# Patient Record
Sex: Male | Born: 1965 | Race: White | Hispanic: No | Marital: Married | Smoking: Never smoker
Health system: Southern US, Community
[De-identification: ages and names within clinical notes are randomized; demographics above are authoritative.]

## PROBLEM LIST (undated history)

## (undated) DIAGNOSIS — N289 Disorder of kidney and ureter, unspecified: Secondary | ICD-10-CM

---

## 2016-01-14 ENCOUNTER — Encounter (HOSPITAL_COMMUNITY): Payer: Self-pay | Admitting: *Deleted

## 2016-01-14 ENCOUNTER — Emergency Department (HOSPITAL_COMMUNITY): Payer: Managed Care, Other (non HMO)

## 2016-01-14 ENCOUNTER — Emergency Department (HOSPITAL_COMMUNITY)
Admission: EM | Admit: 2016-01-14 | Discharge: 2016-01-14 | Disposition: A | Discharge status: Home / Self Care | Payer: Managed Care, Other (non HMO) | Attending: Emergency Medicine | Admitting: Emergency Medicine

## 2016-01-14 DIAGNOSIS — N23 Unspecified renal colic: Secondary | ICD-10-CM | POA: Diagnosis not present

## 2016-01-14 DIAGNOSIS — R109 Unspecified abdominal pain: Secondary | ICD-10-CM

## 2016-01-14 DIAGNOSIS — R911 Solitary pulmonary nodule: Secondary | ICD-10-CM

## 2016-01-14 DIAGNOSIS — Z87442 Personal history of urinary calculi: Secondary | ICD-10-CM | POA: Insufficient documentation

## 2016-01-14 HISTORY — DX: Disorder of kidney and ureter, unspecified: N28.9

## 2016-01-14 LAB — URINE MICROSCOPIC-ADD ON

## 2016-01-14 LAB — BASIC METABOLIC PANEL
ANION GAP: 7 (ref 5–15)
BUN: 17 mg/dL (ref 6–20)
CO2: 27 mmol/L (ref 22–32)
Calcium: 8.8 mg/dL — ABNORMAL LOW (ref 8.9–10.3)
Chloride: 104 mmol/L (ref 101–111)
Creatinine, Ser: 1.26 mg/dL — ABNORMAL HIGH (ref 0.61–1.24)
Glucose, Bld: 118 mg/dL — ABNORMAL HIGH (ref 65–99)
POTASSIUM: 3.8 mmol/L (ref 3.5–5.1)
SODIUM: 138 mmol/L (ref 135–145)

## 2016-01-14 LAB — URINALYSIS, ROUTINE W REFLEX MICROSCOPIC
Bilirubin Urine: NEGATIVE
GLUCOSE, UA: NEGATIVE mg/dL
Ketones, ur: NEGATIVE mg/dL
LEUKOCYTES UA: NEGATIVE
Nitrite: NEGATIVE
PROTEIN: 30 mg/dL — AB
Specific Gravity, Urine: 1.025 (ref 1.005–1.030)
pH: 6 (ref 5.0–8.0)

## 2016-01-14 MED ORDER — ONDANSETRON 4 MG PO TBDP: ORAL_TABLET | ORAL | Status: DC

## 2016-01-14 MED ORDER — HYDROCODONE-ACETAMINOPHEN 5-325 MG PO TABS: 1.0000 | ORAL_TABLET | ORAL | Status: DC | PRN

## 2016-01-14 MED ORDER — HYDROMORPHONE HCL 1 MG/ML IJ SOLN
0.5000 mg | Freq: Once | INTRAMUSCULAR | Status: DC
Start: 2016-01-14 — End: 2016-01-14

## 2016-01-14 MED ORDER — SODIUM CHLORIDE 0.9 % IV BOLUS (SEPSIS)
1000.0000 mL | Freq: Once | INTRAVENOUS | Status: AC
  Administered 2016-01-14: 1000 mL via INTRAVENOUS

## 2016-01-14 MED ORDER — KETOROLAC TROMETHAMINE 30 MG/ML IJ SOLN
30.0000 mg | Freq: Once | INTRAMUSCULAR | Status: AC
  Administered 2016-01-14: 30 mg via INTRAVENOUS
  Filled 2016-01-14: qty 1

## 2016-01-14 MED ORDER — HYDROMORPHONE HCL 1 MG/ML IJ SOLN
1.0000 mg | Freq: Once | INTRAMUSCULAR | Status: AC
  Administered 2016-01-14: 1 mg via INTRAVENOUS
  Filled 2016-01-14: qty 1

## 2016-01-14 MED ORDER — MORPHINE SULFATE (PF) 4 MG/ML IV SOLN
8.0000 mg | Freq: Once | INTRAVENOUS | Status: AC
  Administered 2016-01-14: 8 mg via INTRAVENOUS
  Filled 2016-01-14: qty 2

## 2016-01-14 MED ORDER — NAPROXEN 500 MG PO TABS: 500.0000 mg | ORAL_TABLET | Freq: Two times a day (BID) | ORAL | Status: DC | PRN

## 2016-01-14 NOTE — Discharge Instructions (Signed)
Stay well-hydrated, take pain meds as needed to control pain. Strain urine and follow-up with urology. Return to the ER for uncontrolled pain, persistent vomiting, fevers or new concerns.  If you were given medicines take as directed.  If you are on coumadin or contraceptives realize their levels and effectiveness is altered by many different medicines.  If you have any reaction (rash, tongues swelling, other) to the medicines stop taking and see a physician.    If your blood pressure was elevated in the ER make sure you follow up for management with a primary doctor or return for chest pain, shortness of breath or stroke symptoms.  Please follow up as directed and return to the ER or see a physician for new or worsening symptoms.  Thank you. Filed Vitals:   01/14/16 0203  BP: 156/71  Pulse: 56  Temp: 97.7 F (36.5 C)  TempSrc: Oral  Resp: 18  Height: 5\' 9"  (1.753 m)  Weight: 193 lb (87.544 kg)  SpO2: 97%

## 2016-01-14 NOTE — ED Provider Notes (Signed)
CSN: [id number]     Arrival date & time 01/14/16  [id number] History   First MD Initiated Contact with Patient 01/14/16 0221     Chief Complaint  Patient presents with  . Flank Pain     (Consider location/radiation/quality/duration/timing/severity/associated sxs/prior Treatment) HPI Comments: 50 year old male with kidney stone history years ago presents with worsening flank pain on the left intermittent difficulty getting comfortable since yesterday. Mild decreased appetite. No vomiting. Mild urgency. No fevers or chills. No focal abdominal pain. Nothing specifically improves pain, time.  Patient is a 50 y.o. male presenting with flank pain. The history is provided by the patient.  Flank Pain Pertinent negatives include no chest pain, no abdominal pain, no headaches and no shortness of breath.    Past Medical History  Diagnosis Date  . Renal disorder    History reviewed. No pertinent past surgical history. History reviewed. No pertinent family history. Social History  Substance Use Topics  . Smoking status: Never Smoker   . Smokeless tobacco: None  . Alcohol Use: No    Review of Systems  Constitutional: Positive for appetite change. Negative for fever and chills.  HENT: Negative for congestion.   Eyes: Negative for visual disturbance.  Respiratory: Negative for shortness of breath.   Cardiovascular: Negative for chest pain.  Gastrointestinal: Negative for vomiting and abdominal pain.  Genitourinary: Positive for flank pain. Negative for dysuria.  Musculoskeletal: Negative for back pain, neck pain and neck stiffness.  Skin: Negative for rash.  Neurological: Negative for light-headedness and headaches.      Allergies  Penicillins  Home Medications   Prior to Admission medications   Medication Sig Start Date End Date Taking? Authorizing Provider  HYDROcodone-acetaminophen (NORCO) 5-325 MG tablet Take 1-2 tablets by mouth every 4 (four) hours as needed. 01/14/16   Blane OharaJoshua  Trashaun Streight, MD  naproxen (NAPROSYN) 500 MG tablet Take 1 tablet (500 mg total) by mouth 2 (two) times daily as needed. 01/14/16   Blane OharaJoshua Asser Lucena, MD  ondansetron (ZOFRAN ODT) 4 MG disintegrating tablet 4mg  ODT q4 hours prn nausea/vomit 01/14/16   Blane OharaJoshua Zikeria Keough, MD   BP 156/71 mmHg  Pulse 56  Temp(Src) 97.7 F (36.5 C) (Oral)  Resp 18  Ht 5\' 9"  (1.753 m)  Wt 193 lb (87.544 kg)  BMI 28.49 kg/m2  SpO2 97% Physical Exam  Constitutional: He is oriented to person, place, and time. He appears well-developed and well-nourished.  HENT:  Head: Normocephalic and atraumatic.  Eyes: Right eye exhibits no discharge. Left eye exhibits no discharge.  Neck: Normal range of motion. Neck supple. No tracheal deviation present.  Cardiovascular: Regular rhythm.   Pulmonary/Chest: Effort normal and breath sounds normal.  Abdominal: Soft. He exhibits no distension. There is no tenderness. There is no guarding.  Musculoskeletal: He exhibits no edema.  Neurological: He is alert and oriented to person, place, and time.  Skin: Skin is warm. No rash noted.  Psychiatric: He has a normal mood and affect.  Nursing note and vitals reviewed.   ED Course  Procedures (including critical care time) Emergency Focused Ultrasound Exam Limited retroperitoneal ultrasound of kidneys  Performed and interpreted by Dr. Jodi MourningZavitz Indication: flank pain Focused abdominal ultrasound with both kidneys imaged in transverse and longitudinal planes in real-time. Interpretation: mild left hydronephrosis visualized.   Images archived electronically  CPT Code: [id number] 575 187576775-26 (limited retroperitoneal)  Labs Review Labs Reviewed  URINALYSIS, ROUTINE W REFLEX MICROSCOPIC (NOT [facility]) - Abnormal; Notable for the following:    Hgb urine dipstick LARGE (*)  Protein, ur 30 (*)    All other components within normal limits  BASIC METABOLIC PANEL - Abnormal; Notable for the following:    Glucose, Bld 118 (*)    Creatinine, Ser 1.26 (*)     Calcium 8.8 (*)    All other components within normal limits  URINE MICROSCOPIC-ADD ON - Abnormal; Notable for the following:    Squamous Epithelial / LPF 0-5 (*)    Bacteria, UA FEW (*)    All other components within normal limits    Imaging Review Ct Renal Stone Study  01/14/2016  CLINICAL DATA:  Left flank pain and urinary urgency. Nausea and vomiting. EXAM: CT ABDOMEN AND PELVIS WITHOUT CONTRAST TECHNIQUE: Multidetector CT imaging of the abdomen and pelvis was performed following the standard protocol without IV contrast. COMPARISON:  None. FINDINGS: Lower chest: 3 mm right middle lobe nodule image 4 series 6. Lungs otherwise clear. Liver: Equivocal steatosis. No evidence of focal lesion allowing for lack contrast. Hepatobiliary: Gallbladder physiologically distended, no calcified stone. No biliary dilatation. Pancreas: No ductal dilatation or inflammation. Spleen: Normal. Adrenal glands: No nodule. Kidneys: Obstructing 4 mm stone at the left ureterovesicular junction with moderate proximal hydroureteronephrosis and perinephric stranding. Nonobstructing 3 mm stone in the left lower kidney. Punctate stone in the lower right kidney without right obstructive uropathy. Stomach/Bowel: Stomach physiologically distended. There are no dilated or thickened small bowel loops. Small volume of stool throughout the colon without colonic wall thickening. The appendix is tentatively identified and normal. Vascular/Lymphatic: No retroperitoneal adenopathy. Abdominal aorta is normal in caliber. Reproductive: Normal sized prostate gland. Bladder: Decompressed, no wall thickening. Other: No free air, free fluid, or intra-abdominal fluid collection. Fat in the left inguinal canal. Tiny fat containing umbilical hernia. Musculoskeletal: There are no acute or suspicious osseous abnormalities. IMPRESSION: 1. Obstructing 4 mm stone at the left ureterovesicular junction with moderate hydronephrosis and perinephric stranding. 2.  Additional nonobstructing stones in both kidneys. 3. Right middle lobe pulmonary nodule measuring 3 mm. No follow-up needed if patient is low-risk. Non-contrast chest CT can be considered in 12 months if patient is high-risk. This recommendation follows the consensus statement: Guidelines for Management of Incidental Pulmonary Nodules Detected on CT Images:From the Fleischner Society 2017; published online before print (10.1148/radiol.4098119147). Electronically Signed   By: Rubye Oaks M.D.   On: 01/14/2016 04:50   I have personally reviewed and evaluated these images and lab results as part of my medical decision-making.   EKG Interpretation None      MDM   Final diagnoses:  Acute left flank pain  Incidental pulmonary nodule, less than or equal to 3mm  Renal colic on left side    Patient presents with clinical concern for kidney stone. Patient intermittent waves of pain in the ER. Bedside ultrasound mild hydronephrosis. Urinalysis pending. Plan for IV fluids, screening kidney function and close outpatient follow-up. Initially the plan is to hold on CT scan however patient's pain worsened on reassessment. CT scan ordered for further delineation. Repeat pain meds ordered. Pain controlled at dc.  Discussed small nodule and fup. Results and differential diagnosis were discussed with the patient/parent/guardian. Xrays were independently reviewed by myself.  Close follow up outpatient was discussed, comfortable with the plan.   Medications  sodium chloride 0.9 % bolus 1,000 mL (1,000 mLs Intravenous New Bag/Given 01/14/16 0256)  ketorolac (TORADOL) 30 MG/ML injection 30 mg (30 mg Intravenous Given 01/14/16 0255)  HYDROmorphone (DILAUDID) injection 1 mg (1 mg Intravenous Given 01/14/16 0255)  morphine  4 MG/ML injection 8 mg (8 mg Intravenous Given 01/14/16 0409)    Filed Vitals:   01/14/16 0203  BP: 156/71  Pulse: 56  Temp: 97.7 F (36.5 C)  TempSrc: Oral  Resp: 18  Height:   (1.753 m)  Weight: 193 lb (87.544 kg)  SpO2: 97%    Final diagnoses:  Acute left flank pain  Incidental pulmonary nodule, less than or equal to 3mm  Renal colic on left side       Blane Ohara, MD 01/14/16 0530

## 2016-01-14 NOTE — ED Notes (Addendum)
Pt reports left sided flank pain and urinary urgency. Pt denies n/v.

## 2021-05-07 ENCOUNTER — Emergency Department (HOSPITAL_COMMUNITY)
Admission: EM | Admit: 2021-05-07 | Discharge: 2021-05-07 | Disposition: A | Discharge status: Home / Self Care | Payer: BC Managed Care – PPO | Attending: Emergency Medicine | Admitting: Emergency Medicine

## 2021-05-07 ENCOUNTER — Emergency Department (HOSPITAL_COMMUNITY): Payer: BC Managed Care – PPO

## 2021-05-07 ENCOUNTER — Encounter (HOSPITAL_COMMUNITY): Payer: Self-pay

## 2021-05-07 ENCOUNTER — Other Ambulatory Visit: Payer: Self-pay

## 2021-05-07 DIAGNOSIS — R7989 Other specified abnormal findings of blood chemistry: Secondary | ICD-10-CM | POA: Insufficient documentation

## 2021-05-07 DIAGNOSIS — M545 Low back pain, unspecified: Secondary | ICD-10-CM | POA: Insufficient documentation

## 2021-05-07 DIAGNOSIS — R109 Unspecified abdominal pain: Secondary | ICD-10-CM | POA: Insufficient documentation

## 2021-05-07 LAB — CBC
HCT: 50.2 % (ref 39.0–52.0)
Hemoglobin: 17.3 g/dL — ABNORMAL HIGH (ref 13.0–17.0)
MCH: 30.9 pg (ref 26.0–34.0)
MCHC: 34.5 g/dL (ref 30.0–36.0)
MCV: 89.6 fL (ref 80.0–100.0)
Platelets: 158 10*3/uL (ref 150–400)
RBC: 5.6 MIL/uL (ref 4.22–5.81)
RDW: 12.9 % (ref 11.5–15.5)
WBC: 5.6 10*3/uL (ref 4.0–10.5)
nRBC: 0 % (ref 0.0–0.2)

## 2021-05-07 LAB — BASIC METABOLIC PANEL
Anion gap: 4 — ABNORMAL LOW (ref 5–15)
BUN: 13 mg/dL (ref 6–20)
CO2: 28 mmol/L (ref 22–32)
Calcium: 8.5 mg/dL — ABNORMAL LOW (ref 8.9–10.3)
Chloride: 106 mmol/L (ref 98–111)
Creatinine, Ser: 1.33 mg/dL — ABNORMAL HIGH (ref 0.61–1.24)
GFR, Estimated: >60 mL/min (ref >60)
Glucose, Bld: 104 mg/dL — ABNORMAL HIGH (ref 70–99)
Potassium: 4.7 mmol/L (ref 3.5–5.1)
Sodium: 138 mmol/L (ref 135–145)

## 2021-05-07 LAB — URINALYSIS, DIPSTICK ONLY
Bilirubin Urine: NEGATIVE
Glucose, UA: NEGATIVE mg/dL
Hgb urine dipstick: NEGATIVE
Ketones, ur: 5 mg/dL — AB
Leukocytes,Ua: NEGATIVE
Nitrite: NEGATIVE
Protein, ur: 30 mg/dL — AB
Specific Gravity, Urine: 1.029 (ref 1.005–1.030)
pH: 6 (ref 5.0–8.0)

## 2021-05-07 MED ORDER — CYCLOBENZAPRINE HCL 10 MG PO TABS: 10.0000 mg | ORAL_TABLET | Freq: Two times a day (BID) | ORAL | 0 refills | Status: DC | PRN

## 2021-05-07 MED ORDER — NAPROXEN 500 MG PO TABS: 500.0000 mg | ORAL_TABLET | Freq: Two times a day (BID) | ORAL | 0 refills | Status: DC

## 2021-05-07 MED ORDER — KETOROLAC TROMETHAMINE 30 MG/ML IJ SOLN
30.0000 mg | Freq: Once | INTRAMUSCULAR | Status: AC
  Administered 2021-05-07: 30 mg via INTRAVENOUS
  Filled 2021-05-07: qty 1

## 2021-05-07 NOTE — ED Provider Notes (Signed)
[facility] EMERGENCY DEPARTMENT Provider Note   CSN: [id number] Arrival date & time: 05/07/21  1006     History Chief Complaint  Patient presents with   Back Pain    Andrew Sims is a 55 y.o. male.   Back Pain  Patient presents to the ED for evaluation of back pain.  Patient states its in his lower back somewhat on both sides but maybe more on the right.  Symptoms started the last day or so.  It does seem to move towards the right groin area.  Patient feels like he gets worse with certain movements and positions including sitting down and standing up.  He denies any urinary symptoms.  Patient denies any specific falls or injury.  No excessive heavy lifting.  Does have a history of kidney stones but is not sure if this is a kidney stone or a pulled muscle.  He is not having any nausea or vomiting.  Pain is more intense and severe and that is why he came to the ED today  Past Medical History:  Diagnosis Date   Renal disorder     There are no problems to display for this patient.   History reviewed. No pertinent surgical history.     History reviewed. No pertinent family history.  Social History   Tobacco Use   Smoking status: Never   Smokeless tobacco: Never  Vaping Use   Vaping Use: Never used  Substance Use Topics   Alcohol use: No   Drug use: No    Home Medications Prior to Admission medications   Medication Sig Start Date End Date Taking? Authorizing Provider  cyclobenzaprine (FLEXERIL) 10 MG tablet Take 1 tablet (10 mg total) by mouth 2 (two) times daily as needed for muscle spasms. 05/07/21  Yes Linwood Dibbles, MD  naproxen (NAPROSYN) 500 MG tablet Take 1 tablet (500 mg total) by mouth 2 (two) times daily with a meal. As needed for pain 05/07/21  Yes Linwood Dibbles, MD  HYDROcodone-acetaminophen (NORCO) 5-325 MG tablet Take 1-2 tablets by mouth every 4 (four) hours as needed. 01/14/16   Blane Ohara, MD  ondansetron (ZOFRAN ODT) 4 MG disintegrating tablet 4mg  ODT q4  hours prn nausea/vomit 01/14/16   01/16/16, MD    Allergies    Penicillins  Review of Systems   Review of Systems  Musculoskeletal:  Positive for back pain.  All other systems reviewed and are negative.  Physical Exam Updated Vital Signs BP 131/75 (BP Location: Right Arm)   Pulse 65   Temp 98.5 F (36.9 C) (Oral)   Resp 18   Ht 1.753 m (5\' 9" )   Wt 88.5 kg   SpO2 97%   BMI 28.80 kg/m   Physical Exam Vitals and nursing note reviewed.  Constitutional:      General: He is not in acute distress.    Appearance: He is well-developed.  HENT:     Head: Normocephalic and atraumatic.     Right Ear: External ear normal.     Left Ear: External ear normal.  Eyes:     General: No scleral icterus.       Right eye: No discharge.        Left eye: No discharge.     Conjunctiva/sclera: Conjunctivae normal.  Neck:     Trachea: No tracheal deviation.  Cardiovascular:     Rate and Rhythm: Normal rate and regular rhythm.  Pulmonary:     Effort: Pulmonary effort is normal. No respiratory distress.  Breath sounds: Normal breath sounds. No stridor. No wheezing or rales.  Abdominal:     General: Bowel sounds are normal. There is no distension.     Palpations: Abdomen is soft.     Tenderness: There is no abdominal tenderness. There is no guarding or rebound.  Musculoskeletal:        General: No tenderness or deformity.     Cervical back: Neck supple.     Comments: Tenderness palpation right sided lower back  Skin:    General: Skin is warm and dry.     Findings: No rash.  Neurological:     General: No focal deficit present.     Mental Status: He is alert.     Cranial Nerves: No cranial nerve deficit (no facial droop, extraocular movements intact, no slurred speech).     Sensory: No sensory deficit.     Motor: No abnormal muscle tone or seizure activity.     Coordination: Coordination normal.  Psychiatric:        Mood and Affect: Mood normal.    ED Results / Procedures /  Treatments   Labs (all labs ordered are listed, but only abnormal results are displayed) Labs Reviewed  CBC - Abnormal; Notable for the following components:      Result Value   Hemoglobin 17.3 (*)    All other components within normal limits  BASIC METABOLIC PANEL - Abnormal; Notable for the following components:   Glucose, Bld 104 (*)    Creatinine, Ser 1.33 (*)    Calcium 8.5 (*)    Anion gap 4 (*)    All other components within normal limits  URINALYSIS, DIPSTICK ONLY - Abnormal; Notable for the following components:   Ketones, ur 5 (*)    Protein, ur 30 (*)    All other components within normal limits    EKG None  Radiology CT Renal Stone Study  Result Date: 05/07/2021 CLINICAL DATA:  Low back pain.  Flank pain. EXAM: CT ABDOMEN AND PELVIS WITHOUT CONTRAST TECHNIQUE: Multidetector CT imaging of the abdomen and pelvis was performed following the standard protocol without IV contrast. COMPARISON:  Jan 14, 2016 FINDINGS: Lower chest: No acute abnormality. Hepatobiliary: No focal liver abnormality is seen. No gallstones, gallbladder wall thickening, or biliary dilatation. Pancreas: Unremarkable. No pancreatic ductal dilatation or surrounding inflammatory changes. Spleen: Normal in size without focal abnormality. Adrenals/Urinary Tract: There is a 4 mm stone in the lower pole of the right kidney. There is a 2 mm stone in the mid region of the left kidney on coronal image 62. There is a larger 3 mm stone in the lower pole the left kidney. No hydronephrosis or perinephric stranding. No suspicious masses. No ureterectasis. The bladder is normal. Stomach/Bowel: The stomach and small bowel are unremarkable. Colon and appendix are normal. Vascular/Lymphatic: No significant vascular findings are present. No enlarged abdominal or pelvic lymph nodes. Reproductive: Prostate is unremarkable. Other: A small fat containing left inguinal hernia is identified. A fat containing umbilical hernia is  identified. Musculoskeletal: No acute or significant osseous findings. IMPRESSION: Nonobstructive stones in both kidneys. No cause for the patient's acute flank pain identified. Electronically Signed   By: Gerome Sam III M.D.   On: 05/07/2021 11:52    Procedures Procedures   Medications Ordered in ED Medications  ketorolac (TORADOL) 30 MG/ML injection 30 mg (30 mg Intravenous Given 05/07/21 1110)    ED Course  I have reviewed the triage vital signs and the nursing notes.  Pertinent labs & imaging results that were available during my care of the patient were reviewed by me and considered in my medical decision making (see chart for details).  Clinical Course as of 05/07/21 1306  Sun May 07, 2021  1219 Creatinine slightly elevated but similar to previous values [JK]  1221 Urinalysis without signs of infection [JK]  1221 Kidney stones noted but no ureteral stones [JK]    Clinical Course User Index [JK] Linwood Dibbles, MD   MDM Rules/Calculators/A&P                           Patient presented to the ED for evaluation of of back pain.  Patient has history of kidney stones.  Unclear if symptoms could be related to kidney stones versus musculoskeletal back pain.  Patient had no abdominal tenderness.  Initial laboratory tests were reassuring.  Slight increase in creatinine noted however he does have history of elevated creatinine noted on previous values.  I doubt that is related to his symptoms.  Urinalysis without signs infection.  CT scan does not show any acute abnormalities.  Will discharge home with medications for pain and muscle relaxant.  Recommend outpatient follow-up with a primary care doctor regarding his creatinine. Final Clinical Impression(s) / ED Diagnoses Final diagnoses:  Acute low back pain without sciatica, unspecified back pain laterality  Elevated serum creatinine    Rx / DC Orders ED Discharge Orders          Ordered    naproxen (NAPROSYN) 500 MG tablet  2  times daily with meals        05/07/21 1244    cyclobenzaprine (FLEXERIL) 10 MG tablet  2 times daily PRN        05/07/21 1244             Linwood Dibbles, MD 05/07/21 1308

## 2021-05-07 NOTE — Discharge Instructions (Signed)
Take the medications as needed for pain.  Follow-up with your your primary care doctor to check on your kidney function.

## 2021-05-07 NOTE — ED Triage Notes (Signed)
Pt to er, pt states that he has a hx of kidney stones and this feels similar, pt states that his lower back pain started the other day when he was walking around in the yard, pt ambulatory.   Pt denies trauma or heavy lifting.

## 2021-12-27 IMAGING — CT CT RENAL STONE PROTOCOL
2 of 4 series · 16 of 46 positions shown, 18 images · non-contrast
Comparison: January 14, 2016

CLINICAL DATA: Low back pain.  Flank pain.

EXAM:
CT ABDOMEN AND PELVIS WITHOUT CONTRAST
TECHNIQUE: Multidetector CT imaging of the abdomen and pelvis was performed
following the standard protocol without IV contrast.

[Series 2: axial st · axial · 0.98mm/px · z∈[+385,+840]mm · 13 of 103 slices shown, 15 images]
[im 6/103  soft-tissue]
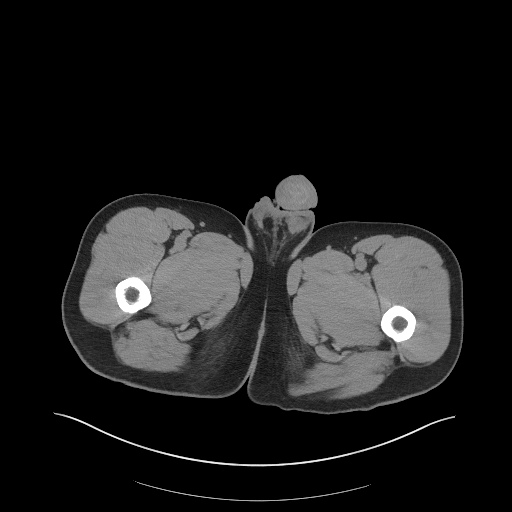
[im 6/103  bone]
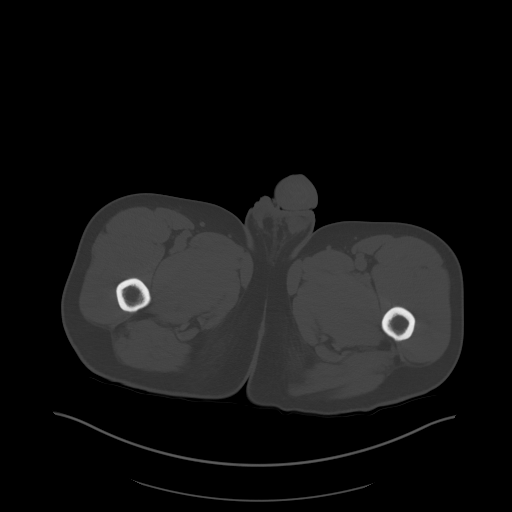
[im 17/103  soft-tissue]
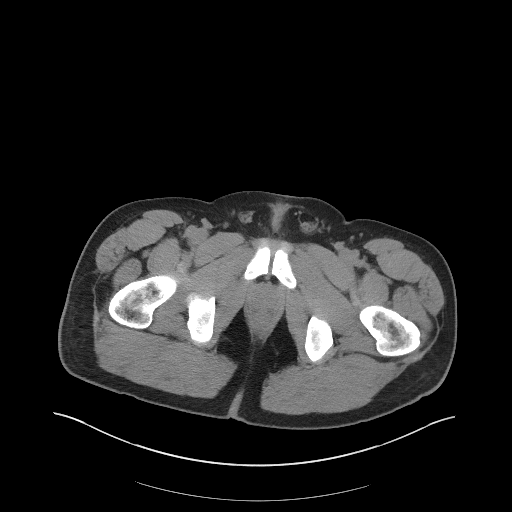
[im 22/103  soft-tissue]
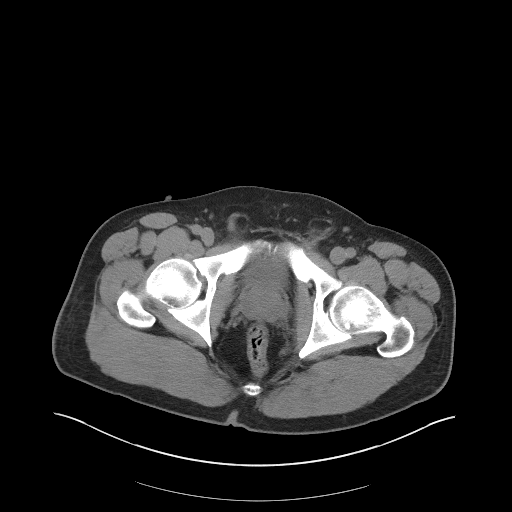
[im 27/103  soft-tissue]
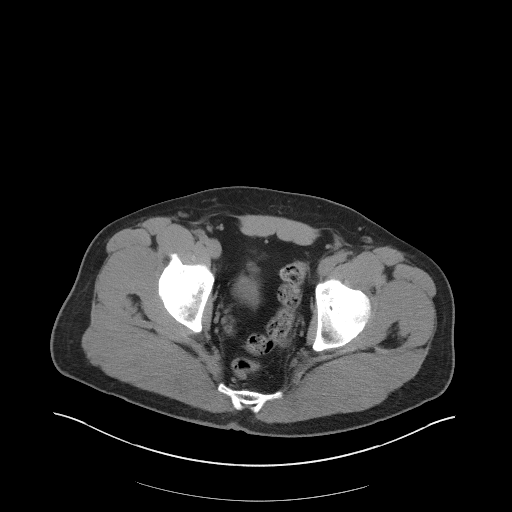
[im 38/103  soft-tissue]
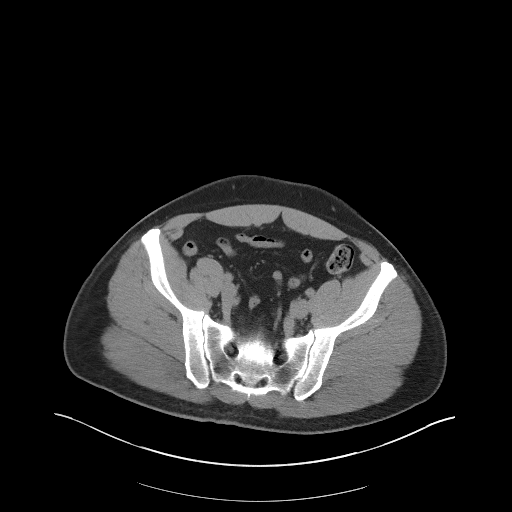
[im 43/103  soft-tissue]
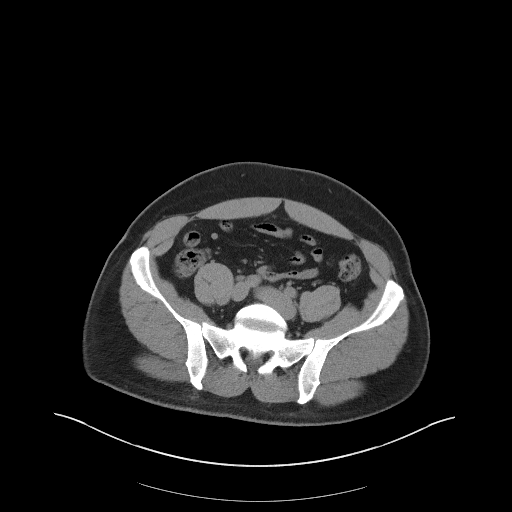
[im 54/103  soft-tissue]
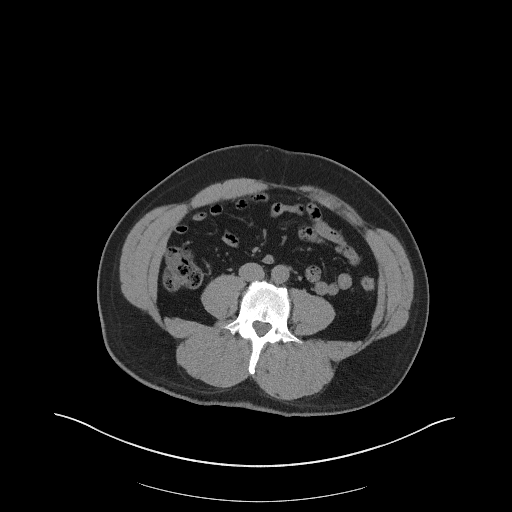
[im 60/103  soft-tissue]
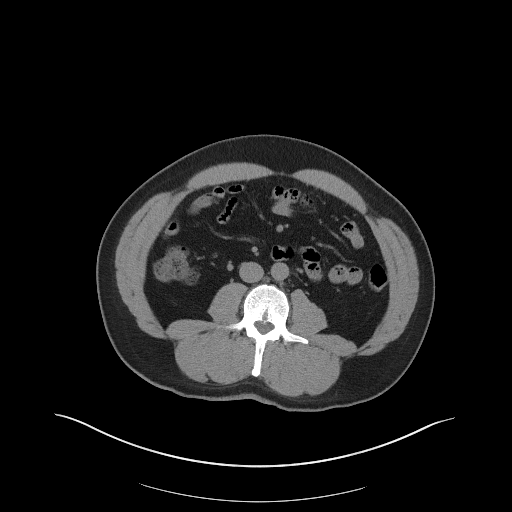
[im 65/103  soft-tissue]
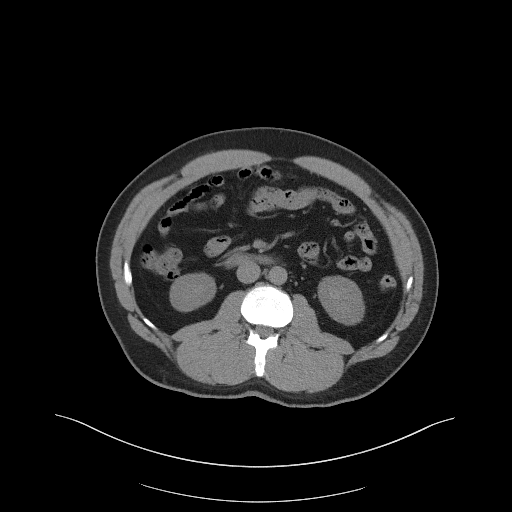
[im 65/103  bone]
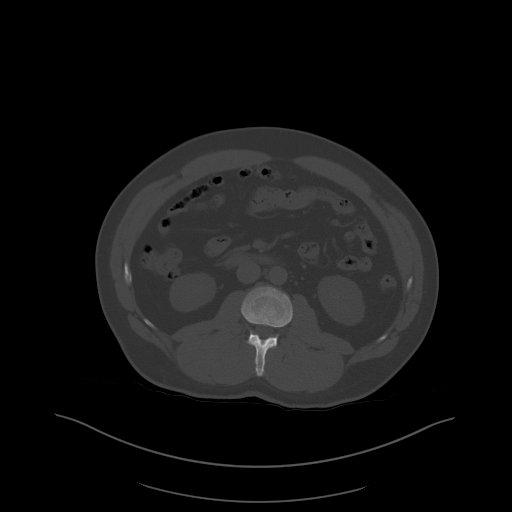
[im 76/103  soft-tissue]
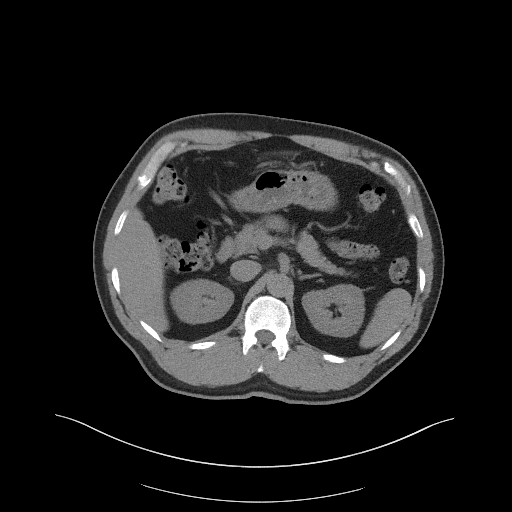
[im 81/103  soft-tissue]
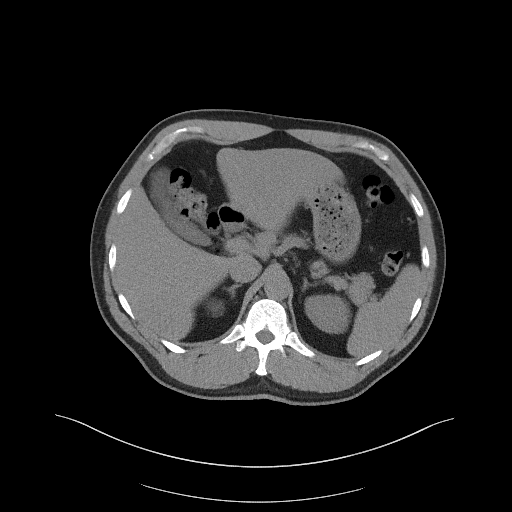
[im 86/103  soft-tissue]
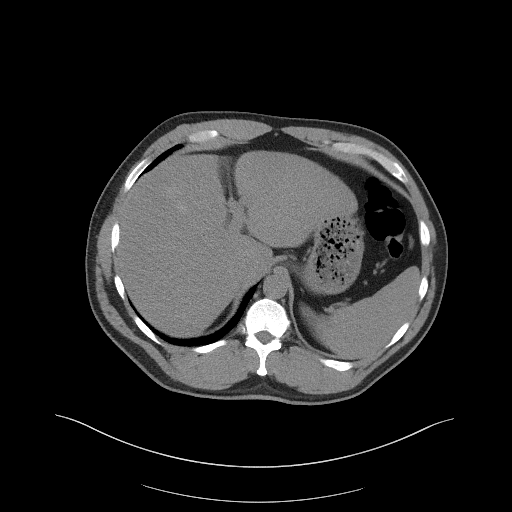
[im 97/103  soft-tissue]
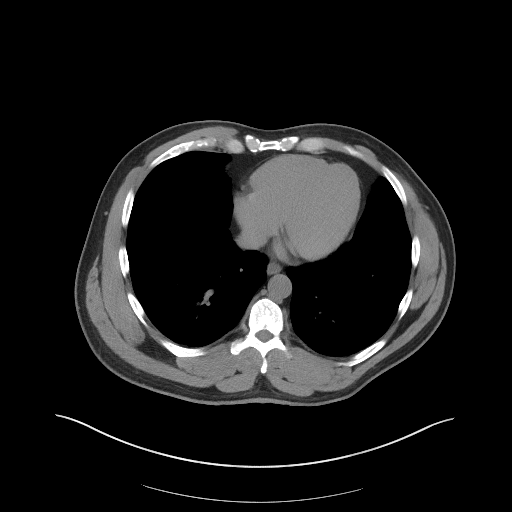

[Series 5: coronal st · coronal · 0.86mm/px · 3 of 120 slices shown]
[im 40/120  soft-tissue]
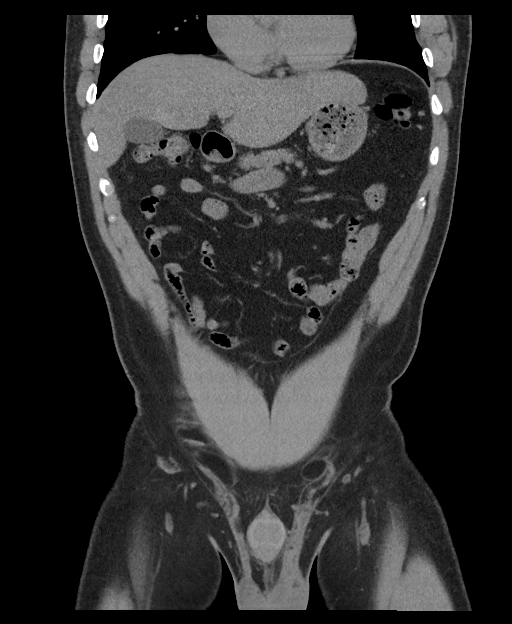
[im 53/120  soft-tissue]
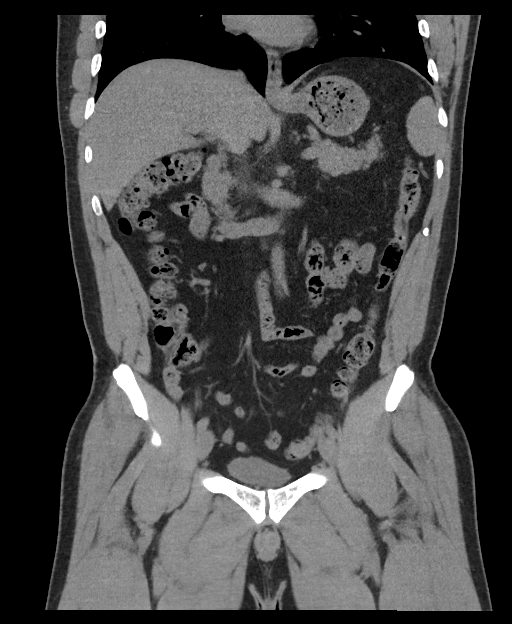
[im 67/120  soft-tissue]
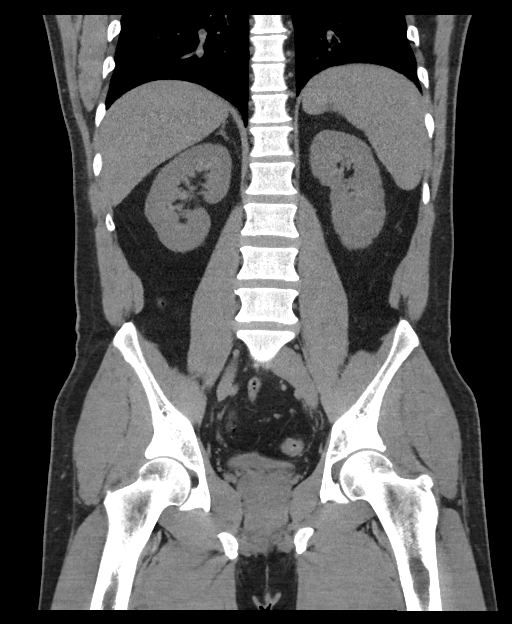

[16 of 46 positions shown; findings below may reference images not displayed]

FINDINGS: Lower chest: No acute abnormality.

Hepatobiliary: No focal liver abnormality is seen. No gallstones,
gallbladder wall thickening, or biliary dilatation.

Pancreas: Unremarkable. No pancreatic ductal dilatation or
surrounding inflammatory changes.

Spleen: Normal in size without focal abnormality.

Adrenals/Urinary Tract: There is a 4 mm stone in the lower pole of
the right kidney. There is a 2 mm stone in the mid region of the
left kidney on coronal image 62. There is a larger 3 mm stone in the
lower pole the left kidney. No hydronephrosis or perinephric
stranding. No suspicious masses. No ureterectasis. The bladder is
normal.

Stomach/Bowel: The stomach and small bowel are unremarkable. Colon
and appendix are normal.

Vascular/Lymphatic: No significant vascular findings are present. No
enlarged abdominal or pelvic lymph nodes.

Reproductive: Prostate is unremarkable.

Other: A small fat containing left inguinal hernia is identified. A
fat containing umbilical hernia is identified.

Musculoskeletal: No acute or significant osseous findings.
IMPRESSION: Nonobstructive stones in both kidneys. No cause for the patient's
acute flank pain identified.

## 2022-01-29 ENCOUNTER — Emergency Department (HOSPITAL_COMMUNITY): Payer: BC Managed Care – PPO

## 2022-01-29 ENCOUNTER — Other Ambulatory Visit: Payer: Self-pay

## 2022-01-29 ENCOUNTER — Encounter (HOSPITAL_COMMUNITY): Payer: Self-pay | Admitting: *Deleted

## 2022-01-29 ENCOUNTER — Emergency Department (HOSPITAL_COMMUNITY)
Admission: EM | Admit: 2022-01-29 | Discharge: 2022-01-30 | Disposition: A | Discharge status: Home / Self Care | Payer: BC Managed Care – PPO | Attending: Emergency Medicine | Admitting: Emergency Medicine

## 2022-01-29 DIAGNOSIS — N23 Unspecified renal colic: Secondary | ICD-10-CM

## 2022-01-29 DIAGNOSIS — R109 Unspecified abdominal pain: Secondary | ICD-10-CM | POA: Insufficient documentation

## 2022-01-29 LAB — BASIC METABOLIC PANEL
Anion gap: 5 (ref 5–15)
BUN: 14 mg/dL (ref 6–20)
CO2: 28 mmol/L (ref 22–32)
Calcium: 9 mg/dL (ref 8.9–10.3)
Chloride: 107 mmol/L (ref 98–111)
Creatinine, Ser: 1.22 mg/dL (ref 0.61–1.24)
GFR, Estimated: >60 mL/min (ref >60)
Glucose, Bld: 114 mg/dL — ABNORMAL HIGH (ref 70–99)
Potassium: 3.5 mmol/L (ref 3.5–5.1)
Sodium: 140 mmol/L (ref 135–145)

## 2022-01-29 LAB — CBC WITH DIFFERENTIAL/PLATELET
Abs Immature Granulocytes: 0.01 10*3/uL (ref 0.00–0.07)
Basophils Absolute: 0 10*3/uL (ref 0.0–0.1)
Basophils Relative: 0 %
Eosinophils Absolute: 0.1 10*3/uL (ref 0.0–0.5)
Eosinophils Relative: 1 %
HCT: 47.4 % (ref 39.0–52.0)
Hemoglobin: 16.7 g/dL (ref 13.0–17.0)
Immature Granulocytes: 0 %
Lymphocytes Relative: 28 %
Lymphs Abs: 1.4 10*3/uL (ref 0.7–4.0)
MCH: 30.9 pg (ref 26.0–34.0)
MCHC: 35.2 g/dL (ref 30.0–36.0)
MCV: 87.8 fL (ref 80.0–100.0)
Monocytes Absolute: 0.6 10*3/uL (ref 0.1–1.0)
Monocytes Relative: 11 %
Neutro Abs: 2.8 10*3/uL (ref 1.7–7.7)
Neutrophils Relative %: 60 %
Platelets: 133 10*3/uL — ABNORMAL LOW (ref 150–400)
RBC: 5.4 MIL/uL (ref 4.22–5.81)
RDW: 12.3 % (ref 11.5–15.5)
WBC: 4.8 10*3/uL (ref 4.0–10.5)
nRBC: 0 % (ref 0.0–0.2)

## 2022-01-29 MED ORDER — SODIUM CHLORIDE 0.9 % IV BOLUS
1000.0000 mL | Freq: Once | INTRAVENOUS | Status: AC
  Administered 2022-01-29: 1000 mL via INTRAVENOUS

## 2022-01-29 MED ORDER — HYDROMORPHONE HCL 1 MG/ML IJ SOLN
1.0000 mg | Freq: Once | INTRAMUSCULAR | Status: AC
  Administered 2022-01-29: 1 mg via INTRAVENOUS
  Filled 2022-01-29: qty 1

## 2022-01-29 MED ORDER — KETOROLAC TROMETHAMINE 15 MG/ML IJ SOLN
15.0000 mg | Freq: Once | INTRAMUSCULAR | Status: AC
  Administered 2022-01-29: 15 mg via INTRAVENOUS
  Filled 2022-01-29: qty 1

## 2022-01-29 NOTE — ED Provider Notes (Signed)
[facility] EMERGENCY DEPARTMENT Provider Note   CSN: [id number] Arrival date & time: 01/29/22  2131     History  Chief Complaint  Patient presents with   Flank Pain    Lott Seelbach is a 56 y.o. male.  Patient presents with left-sided flank pain on and off for past week.  He states he was seen at outside ER in [place] about a week ago diagnosed with kidney stone.  He states he was told he had several.  He thinks he passed the first 1 and was symptom-free for some time until recurrence of pain tonight.  Describes sharp aching pain in left flank.  No associated fevers positive nausea no vomiting or diarrhea.       Home Medications Prior to Admission medications   Medication Sig Start Date End Date Taking? Authorizing Provider  cyclobenzaprine (FLEXERIL) 10 MG tablet Take 1 tablet (10 mg total) by mouth 2 (two) times daily as needed for muscle spasms. 05/07/21   Linwood Dibbles, MD  HYDROcodone-acetaminophen (NORCO) 5-325 MG tablet Take 1-2 tablets by mouth every 4 (four) hours as needed. 01/14/16   Blane Ohara, MD  naproxen (NAPROSYN) 500 MG tablet Take 1 tablet (500 mg total) by mouth 2 (two) times daily with a meal. As needed for pain 05/07/21   Linwood Dibbles, MD  ondansetron (ZOFRAN ODT) 4 MG disintegrating tablet 4mg  ODT q4 hours prn nausea/vomit 01/14/16   01/16/16, MD      Allergies    Penicillins    Review of Systems   Review of Systems  Constitutional:  Negative for fever.  HENT:  Negative for ear pain and sore throat.   Eyes:  Negative for pain.  Respiratory:  Negative for cough.   Cardiovascular:  Negative for chest pain.  Gastrointestinal:  Negative for abdominal pain.  Genitourinary:  Positive for flank pain.  Musculoskeletal:  Negative for back pain.  Skin:  Negative for color change and rash.  Neurological:  Negative for syncope.  All other systems reviewed and are negative.   Physical Exam Updated Vital Signs BP (!) 127/94   Pulse (!) 58   Temp (!)  97.5 F (36.4 C) (Oral)   Resp 18   SpO2 96%  Physical Exam Constitutional:      Appearance: He is well-developed.  HENT:     Head: Normocephalic.     Nose: Nose normal.  Eyes:     Extraocular Movements: Extraocular movements intact.  Cardiovascular:     Rate and Rhythm: Normal rate.  Pulmonary:     Effort: Pulmonary effort is normal.  Abdominal:     Tenderness: There is no right CVA tenderness or left CVA tenderness.  Skin:    Coloration: Skin is not jaundiced.  Neurological:     Mental Status: He is alert. Mental status is at baseline.     ED Results / Procedures / Treatments   Labs (all labs ordered are listed, but only abnormal results are displayed) Labs Reviewed  CBC WITH DIFFERENTIAL/PLATELET - Abnormal; Notable for the following components:      Result Value   Platelets 133 (*)    All other components within normal limits  BASIC METABOLIC PANEL - Abnormal; Notable for the following components:   Glucose, Bld 114 (*)    All other components within normal limits  URINALYSIS, ROUTINE W REFLEX MICROSCOPIC    EKG None  Radiology DG Abdomen 1 View  Result Date: 01/29/2022 CLINICAL DATA:  Left flank pain EXAM: ABDOMEN -  1 VIEW COMPARISON:  None Available. FINDINGS: The bowel gas pattern is normal. No radio-opaque calculi or other significant radiographic abnormality are seen. IMPRESSION: Negative. Electronically Signed   By: Helyn Numbers M.D.   On: 01/29/2022 23:27    Procedures Procedures    Medications Ordered in ED Medications  ketorolac (TORADOL) 15 MG/ML injection 15 mg (15 mg Intravenous Given 01/29/22 2239)  HYDROmorphone (DILAUDID) injection 1 mg (1 mg Intravenous Given 01/29/22 2239)  sodium chloride 0.9 % bolus 1,000 mL (0 mLs Intravenous Stopped 01/29/22 2341)    ED Course/ Medical Decision Making/ A&P                           Medical Decision Making Amount and/or Complexity of Data Reviewed Labs: ordered. Radiology:  ordered.  Risk Prescription drug management.   Review of external records shows visit for low back pain 2022.  History from family at bedside.  Dynastat include labs CBC chemistry unremarkable.  Patient's pain well controlled with Toradol and Dilaudid.  Anticipate discharge home with urology follow-up, will be signed out to oncoming provider.        Final Clinical Impression(s) / ED Diagnoses Final diagnoses:  Flank pain    Rx / DC Orders ED Discharge Orders     None         Cheryll Cockayne, MD 01/29/22 2350

## 2022-01-29 NOTE — ED Triage Notes (Signed)
Left flank pain x 1 week. Seen at ED/urologist last week-known kidney stone. Pain increased. Last pain meds about 4 hours ago, Vicodin.

## 2022-01-30 LAB — URINALYSIS, ROUTINE W REFLEX MICROSCOPIC
Bacteria, UA: NONE SEEN
Bilirubin Urine: NEGATIVE
Glucose, UA: NEGATIVE mg/dL
Ketones, ur: NEGATIVE mg/dL
Leukocytes,Ua: NEGATIVE
Nitrite: NEGATIVE
Protein, ur: 30 mg/dL — AB
RBC / HPF: >50 RBC/hpf — ABNORMAL HIGH (ref 0–5)
Specific Gravity, Urine: 1.019 (ref 1.005–1.030)
pH: 5 (ref 5.0–8.0)

## 2022-01-30 MED ORDER — ONDANSETRON HCL 4 MG PO TABS: 4.0000 mg | ORAL_TABLET | Freq: Three times a day (TID) | ORAL | 0 refills | Status: AC | PRN

## 2022-01-30 MED ORDER — ONDANSETRON 4 MG PO TBDP: ORAL_TABLET | ORAL | 0 refills | Status: DC

## 2022-01-30 MED ORDER — OXYCODONE-ACETAMINOPHEN 5-325 MG PO TABS: 1.0000 | ORAL_TABLET | Freq: Four times a day (QID) | ORAL | 0 refills | Status: AC | PRN

## 2022-01-30 MED ORDER — OXYCODONE-ACETAMINOPHEN 5-325 MG PO TABS: 1.0000 | ORAL_TABLET | Freq: Three times a day (TID) | ORAL | 0 refills | Status: DC | PRN

## 2022-01-30 MED ORDER — OXYCODONE-ACETAMINOPHEN 5-325 MG PO TABS: 1.0000 | ORAL_TABLET | Freq: Three times a day (TID) | ORAL | 0 refills | Status: AC | PRN

## 2022-01-30 MED ORDER — KETOROLAC TROMETHAMINE 10 MG PO TABS: 10.0000 mg | ORAL_TABLET | Freq: Three times a day (TID) | ORAL | 0 refills | Status: AC

## 2022-01-30 MED ORDER — ONDANSETRON HCL 4 MG/2ML IJ SOLN
4.0000 mg | Freq: Once | INTRAMUSCULAR | Status: AC
  Administered 2022-01-30: 4 mg via INTRAVENOUS
  Filled 2022-01-30: qty 2

## 2022-01-30 MED ORDER — ONDANSETRON 4 MG PO TBDP: ORAL_TABLET | ORAL | 0 refills | Status: AC

## 2022-01-30 NOTE — ED Provider Notes (Signed)
1:55 AM Assumed care from Dr. Audley Hose, please see their note for full history, physical and decision making until this point. In brief this is a 56 y.o. year old male who presented to the ED tonight with Flank Pain     Known kidney stones, pending UA for possible further treatmetn and ultimate disposition.   UA ok. Patient nauseous, zofran ordered.   Patient feels much better. Pain free. Will rx meds per previous provider plan. [facility] consult placed.   Discharge instructions, including strict return precautions for new or worsening symptoms, given. Patient and/or family verbalized understanding and agreement with the plan as described.   Labs, studies and imaging reviewed by myself and considered in medical decision making if ordered. Imaging interpreted by radiology.  Labs Reviewed  CBC WITH DIFFERENTIAL/PLATELET - Abnormal; Notable for the following components:      Result Value   Platelets 133 (*)    All other components within normal limits  BASIC METABOLIC PANEL - Abnormal; Notable for the following components:   Glucose, Bld 114 (*)    All other components within normal limits  URINALYSIS, ROUTINE W REFLEX MICROSCOPIC - Abnormal; Notable for the following components:   APPearance HAZY (*)    Hgb urine dipstick LARGE (*)    Protein, ur 30 (*)    RBC / HPF >50 (*)    All other components within normal limits    DG Abdomen 1 View  Final Result      No follow-ups on file.    Daesean Lazarz, Barbara Cower, MD 01/30/22 [phone]

## 2022-01-30 NOTE — ED Notes (Signed)
ED Provider at bedside. 

## 2022-01-31 MED FILL — Oxycodone w/ Acetaminophen Tab 5-325 MG: ORAL | Qty: 6 | Status: AC

## 2022-01-31 MED FILL — Ondansetron HCl Tab 4 MG: ORAL | Qty: 4 | Status: AC

## 2022-02-09 ENCOUNTER — Encounter: Payer: Self-pay | Admitting: *Deleted

## 2022-09-20 IMAGING — DX DG ABDOMEN 1V
2 series · 2 of 2 positions shown · non-contrast
Comparison: None Available.

CLINICAL DATA: Left flank pain

EXAM:
ABDOMEN - 1 VIEW

[abdomen supine (1 of 2)]
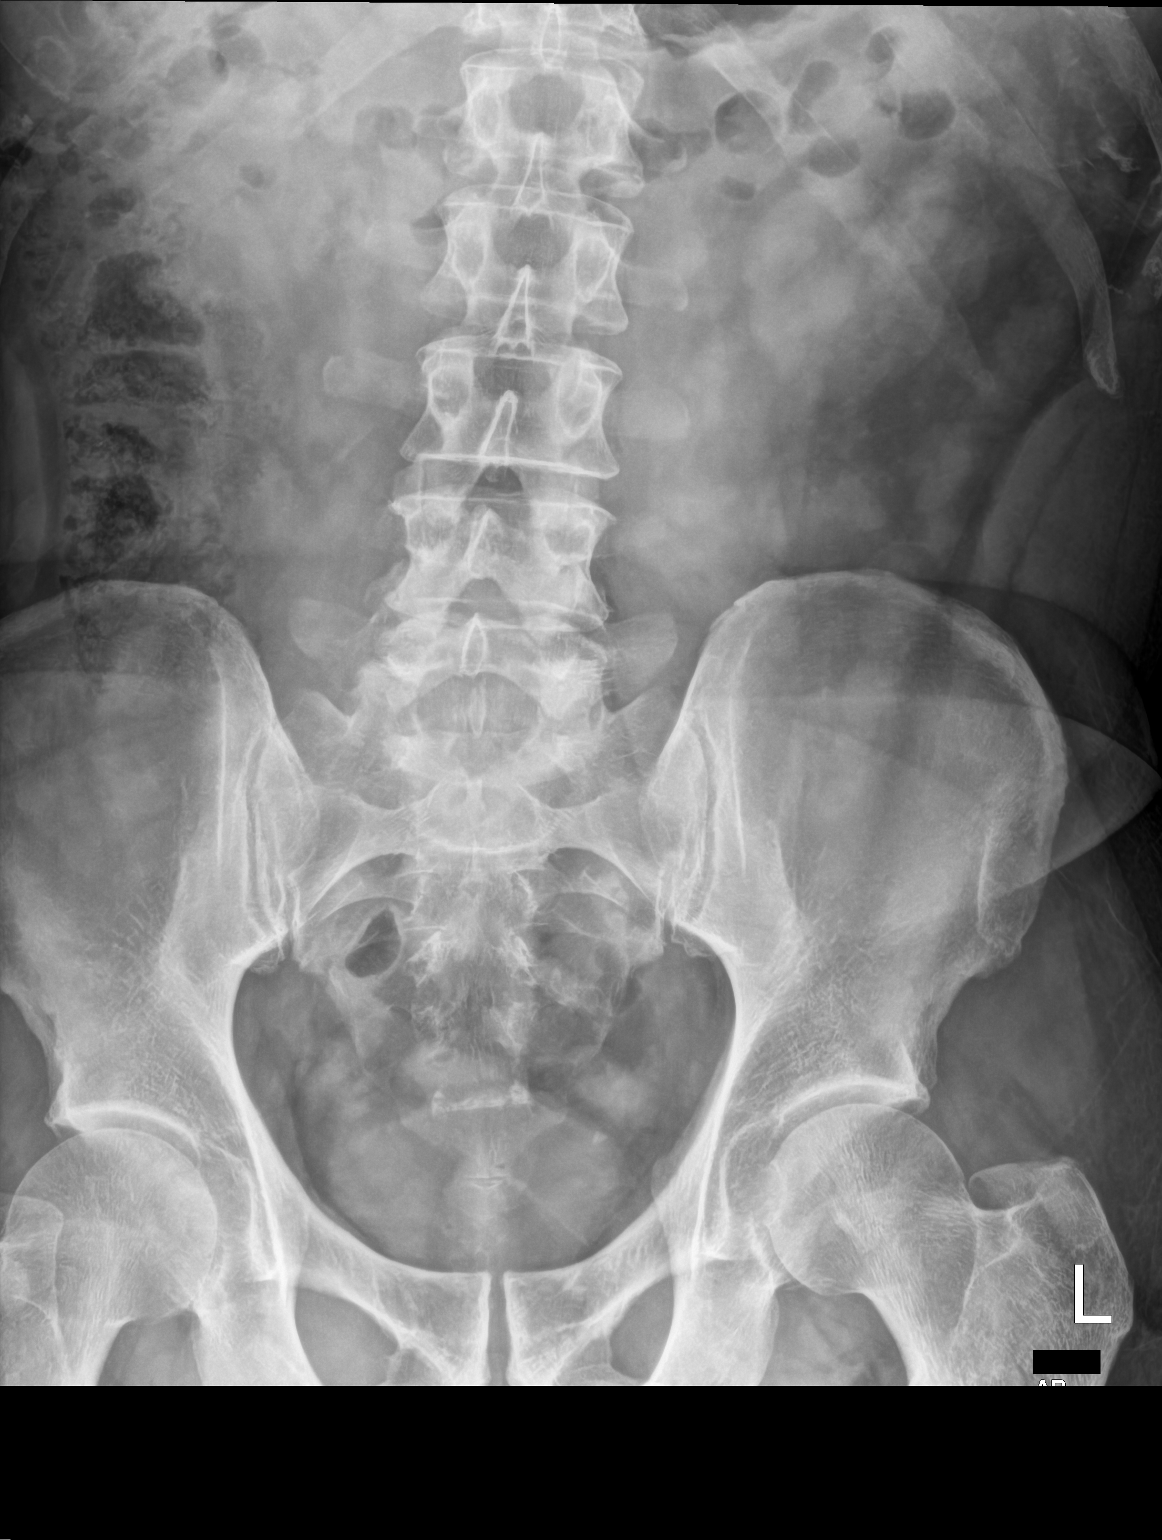

[abdomen supine (2 of 2)]
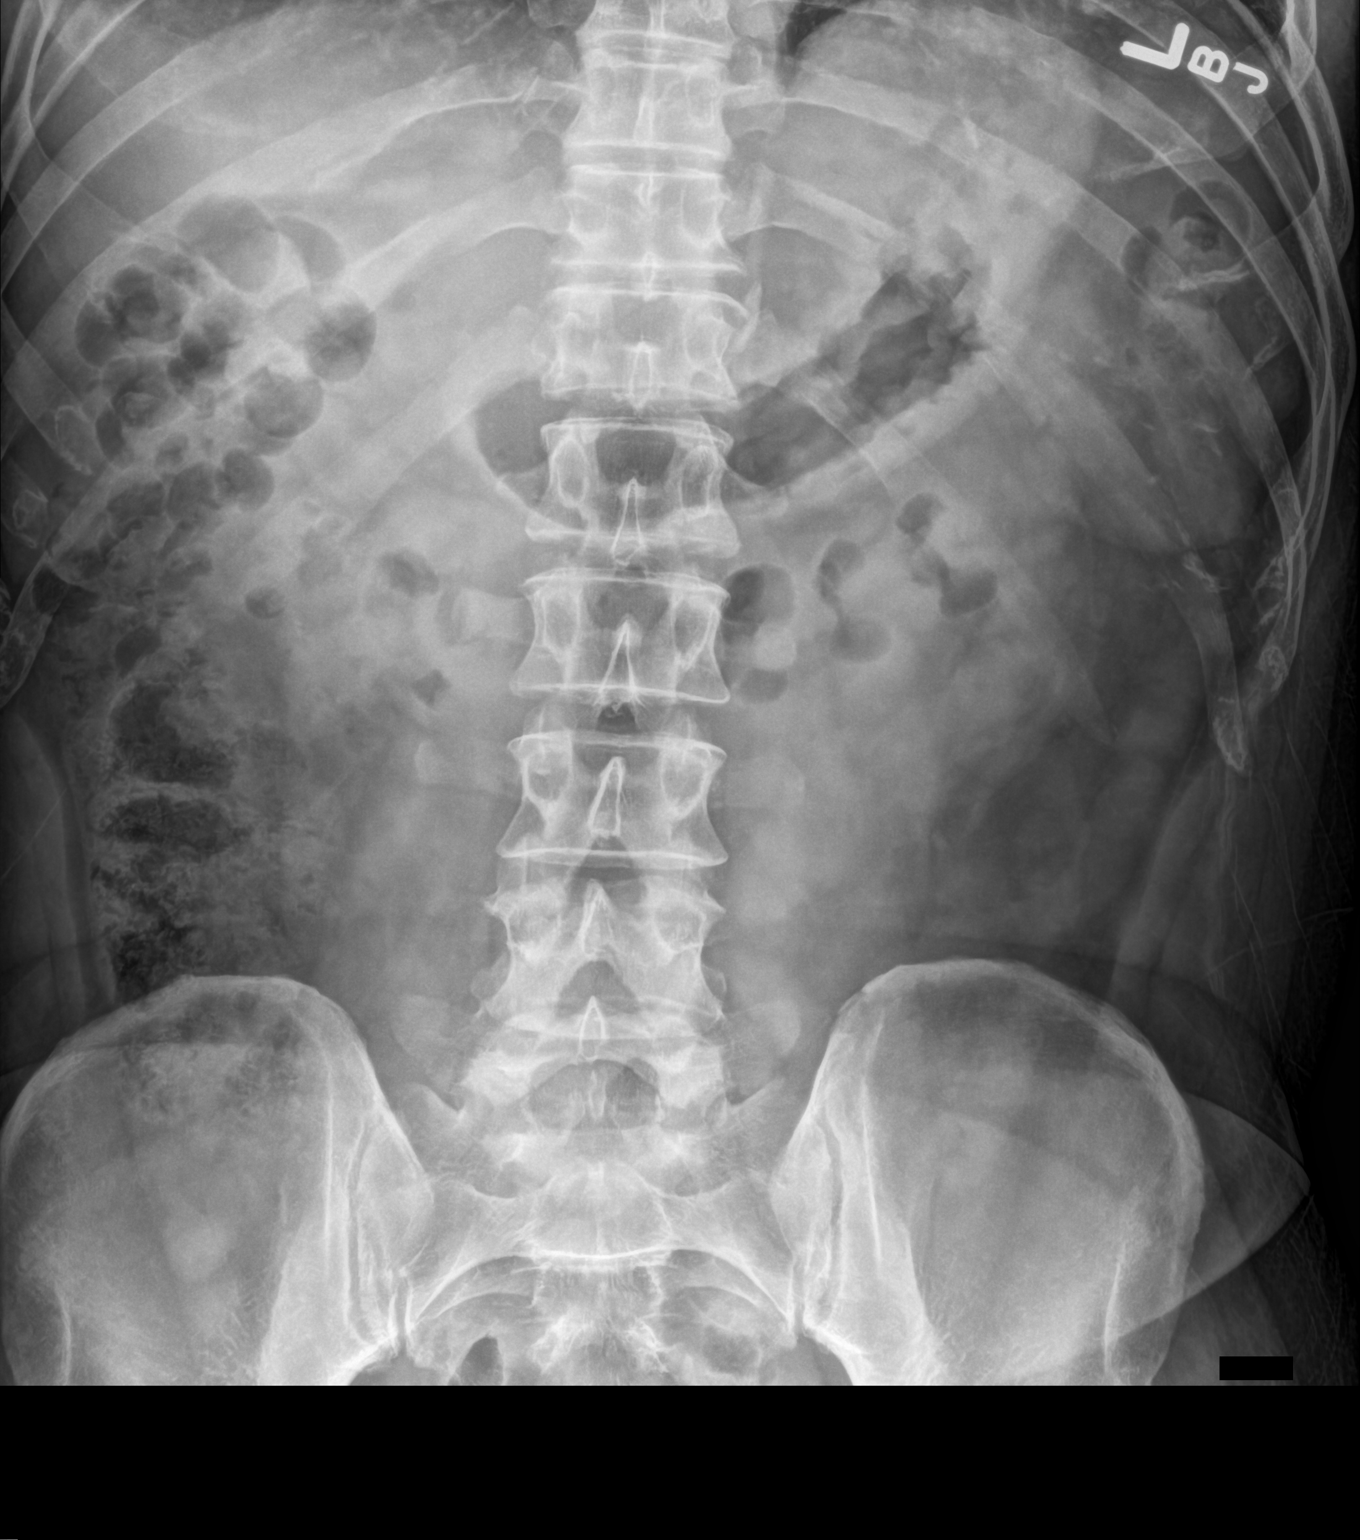

[2 of 2 positions shown; findings below may reference images not displayed]

FINDINGS: The bowel gas pattern is normal. No radio-opaque calculi or other
significant radiographic abnormality are seen.
IMPRESSION: Negative.
